# Patient Record
Sex: Male | Born: 2005 | Race: Black or African American | Hispanic: No | Marital: Single | Smoking: Never smoker
Health system: Southern US, Community
[De-identification: ages and names within clinical notes are randomized; demographics above are authoritative.]

---
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
## 2025-01-14 ENCOUNTER — Ambulatory Visit
Admission: EM | Admit: 2025-01-14 | Discharge: 2025-01-14 | Disposition: A | Discharge status: Home / Self Care | Attending: Family Medicine | Admitting: Family Medicine

## 2025-01-14 DIAGNOSIS — R112 Nausea with vomiting, unspecified: Secondary | ICD-10-CM

## 2025-01-14 DIAGNOSIS — K529 Noninfective gastroenteritis and colitis, unspecified: Secondary | ICD-10-CM | POA: Diagnosis not present

## 2025-01-14 MED ORDER — LOPERAMIDE HCL 2 MG PO CAPS: 2.0000 mg | ORAL_CAPSULE | Freq: Two times a day (BID) | ORAL | 0 refills | Status: AC | PRN

## 2025-01-14 MED ORDER — ONDANSETRON HCL 4 MG/2ML IJ SOLN
4.0000 mg | Freq: Once | INTRAMUSCULAR | Status: AC
  Administered 2025-01-14: 4 mg via INTRAMUSCULAR

## 2025-01-14 MED ORDER — ONDANSETRON 4 MG PO TBDP: 4.0000 mg | ORAL_TABLET | Freq: Three times a day (TID) | ORAL | 0 refills | Status: AC | PRN

## 2025-01-14 NOTE — ED Provider Notes (Signed)
 " [facility]  Note:  This document was prepared using Conservation officer, historic buildings and may include unintentional dictation errors.  MRN: [id number] DOB: 04-11-2006  Subjective:   Joseph Walter is a 19 y.o. male presenting for acute onset overnight of nausea, vomiting, diarrhea.  Patient reports that he ate Japanese food last night and immediately felt off.  Overnight around 01:30 his symptoms started.  Has been able to tolerate very small amounts of water, fluids.  No fever, bloody stools, hematemesis, recent antibiotic use, hospitalizations or long distance travel.  Has not eaten raw foods, drank unfiltered water.  No history of GI disorders including Crohn's, IBS, ulcerative colitis.   Current Outpatient Medications  Medication Instructions   cetirizine  (ZYRTEC  ALLERGY) 10 mg, Oral, Daily   mupirocin  ointment (BACTROBAN ) 2 % 1 Application, Topical, 2 times daily, For up to one week.    Allergies[1]  History reviewed. No pertinent past medical history.   History reviewed. No pertinent surgical history.  No family history on file.  Social History   Occupational History   Not on file  Tobacco Use   Smoking status: Never    Passive exposure: Never   Smokeless tobacco: Never  Vaping Use   Vaping status: Never Used  Substance and Sexual Activity   Alcohol use: No   Drug use: No   Sexual activity: Not on file     ROS   Objective:   Vitals: BP (!) 111/56 (BP Location: Right Arm)   Pulse (!) 111   Temp 98.7 F (37.1 C) (Oral)   Resp 20   Wt 158 lb 6.4 oz (71.8 kg)   SpO2 97%   BMI 23.39 kg/m   Pulse recheck ranged from 88bpm-101bpm.   Physical Exam Constitutional:      General: He is not in acute distress.    Appearance: Normal appearance. He is well-developed and normal weight. He is not ill-appearing, toxic-appearing or diaphoretic.  HENT:     Head: Normocephalic and atraumatic.     Right Ear: External ear normal.     Left  Ear: External ear normal.     Nose: Nose normal.     Mouth/Throat:     Mouth: Mucous membranes are moist.     Pharynx: Oropharynx is clear.  Eyes:     General: No scleral icterus.       Right eye: No discharge.        Left eye: No discharge.     Extraocular Movements: Extraocular movements intact.     Conjunctiva/sclera: Conjunctivae normal.  Cardiovascular:     Rate and Rhythm: Normal rate and regular rhythm.     Heart sounds: Normal heart sounds. No murmur heard.    No friction rub. No gallop.  Pulmonary:     Effort: Pulmonary effort is normal. No respiratory distress.     Breath sounds: Normal breath sounds. No stridor. No wheezing, rhonchi or rales.  Abdominal:     General: Bowel sounds are increased. There is no distension.     Palpations: Abdomen is soft. There is no mass.     Tenderness: There is no abdominal tenderness. There is no right CVA tenderness, left CVA tenderness, guarding or rebound.  Musculoskeletal:     Cervical back: Normal range of motion.  Skin:    General: Skin is warm and dry.  Neurological:     Mental Status: He is alert and oriented to person, place, and time.  Psychiatric:  Mood and Affect: Mood normal.        Behavior: Behavior normal.        Thought Content: Thought content normal.        Judgment: Judgment normal.    IM Zofran  4mg  administered in clinic.   Assessment and Plan :   PDMP not reviewed this encounter.  1. Noninfectious gastroenteritis, unspecified type   2. Nausea vomiting and diarrhea      Will manage for noninfectious gastroenteritis and colitis with supportive care.  Rx for Zofran  for nausea and vomiting, Imodium  for diarrhea.  Patient is to push fluids and eat light meals including soups and soft foods. Counseled patient on potential for adverse effects with medications prescribed/recommended today, ER and return-to-clinic precautions discussed, patient verbalized understanding.     [1] No Known Allergies     Christopher Savannah, PA-C 01/14/25 1520  "

## 2025-01-14 NOTE — ED Triage Notes (Signed)
 Pt c/o n/v/d started ~130am after eating japanese food-denies pain-NAD-steady gait

## 2025-01-14 NOTE — Discharge Instructions (Addendum)

## 2025-02-11 ENCOUNTER — Ambulatory Visit: Admitting: Dermatology

## 2025-06-23 ENCOUNTER — Encounter (HOSPITAL_BASED_OUTPATIENT_CLINIC_OR_DEPARTMENT_OTHER): Admitting: Family Medicine
